# Patient Record
Sex: Male | Born: 2000 | Race: White | Hispanic: No | Marital: Single
Health system: Midwestern US, Community
[De-identification: ages and names within clinical notes are randomized; demographics above are authoritative.]

---
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
## 2019-01-28 ENCOUNTER — Ambulatory Visit: Admitting: Family Medicine

## 2022-04-14 ENCOUNTER — Emergency Department: Admit: 2022-04-15

## 2022-04-14 DIAGNOSIS — J40 Bronchitis, not specified as acute or chronic: Secondary | ICD-10-CM

## 2022-04-14 NOTE — Discharge Instructions (Signed)
Take medications as prescribed, follow-up with primary care doctor, return to the emergency department for any worsening symptoms, problems or concerns

## 2022-04-14 NOTE — ED Provider Notes (Cosign Needed)
[facility]ERGENCY DEPT  EMERGENCY DEPARTMENT ENCOUNTER      Pt Name: Barry English  MRN: [id number]  Birthdate 05-26-2001  Date of evaluation: 04/14/2022  Provider: Philomena Doheny Zakariyya Helfman, PA-C    CHIEF COMPLAINT       Chief Complaint   Patient presents with    Pleurisy     C/o cough and pain with inspiration and pt quit vaping approx 1.5 weeks ago. Pt also c/o feeling a "popping in the top left of my heart" x 1-2 years. Pt denies any pain and reports that he's recently being having anxiety which is new for him.          HISTORY OF PRESENT ILLNESS   (Location/Symptom, Timing/Onset, Context/Setting, Quality, Duration, Modifying Factors, Severity)  Note limiting factors.       Patient is a 21 year old male who presents emergency department with complaints of chest pain.  Patient states that he quit vaping about a week and a half ago, has had cough for the past several days.  States that this is causing chest pain to his left rib area.  He also reports intermittent chest pain with feeling a "pop" on the left side of his chest when he turns too far to the left side.  States that he recently started to have anxiety symptoms chest pain that he is experiencing now on his left side is associated with cough and deep inspiration.  Denies any shortness of breath, fever, chills, myalgias, arthralgias.  No other complaints at this time.        Nursing Notes were reviewed.    REVIEW OF SYSTEMS    (2-9 systems for level 4, 10 or more for level 5)     Review of Systems   Constitutional:  Negative for fever.   Respiratory:  Positive for cough. Negative for shortness of breath.    Cardiovascular:  Positive for chest pain.   All other systems reviewed and are negative.    Except as noted above the remainder of the review of systems was reviewed and negative.       PAST MEDICAL HISTORY   No past medical history on file.      SURGICAL HISTORY     No past surgical history on file.      CURRENT MEDICATIONS       Previous Medications    No  medications on file       ALLERGIES     Amoxicillin and Penicillins    FAMILY HISTORY     No family history on file.       SOCIAL HISTORY       Social History     Socioeconomic History    Marital status: Single       SCREENINGS         Glasgow Coma Scale  Eye Opening: Spontaneous  Best Verbal Response: Oriented  Best Motor Response: Obeys commands  Glasgow Coma Scale Score: 15                     CIWA Assessment  BP: (!) 146/99  Pulse: 74                 PHYSICAL EXAM    (up to 7 for level 4, 8 or more for level 5)     ED Triage Vitals [04/14/22 2125]   BP Temp Temp Source Pulse Respirations SpO2 Height Weight - Scale   (!) 146/99 98.6 F (37 C) Oral 74  18 99 % 6\' 2"  (1.88 m) 162 lb (73.5 kg)       Physical Exam  Vitals and nursing note reviewed.   Constitutional:       General: He is not in acute distress.     Appearance: Normal appearance. He is not ill-appearing, toxic-appearing or diaphoretic.   HENT:      Head: Normocephalic and atraumatic.      Mouth/Throat:      Mouth: Mucous membranes are moist.   Eyes:      Extraocular Movements: Extraocular movements intact.      Pupils: Pupils are equal, round, and reactive to light.   Cardiovascular:      Rate and Rhythm: Normal rate and regular rhythm.      Pulses: Normal pulses.      Heart sounds: Normal heart sounds. No murmur heard.    No gallop.   Pulmonary:      Effort: Pulmonary effort is normal.      Breath sounds: Normal breath sounds. No stridor. No wheezing or rhonchi.   Abdominal:      General: Abdomen is flat. There is no distension.      Palpations: Abdomen is soft. There is no mass.      Tenderness: There is no abdominal tenderness. There is no guarding or rebound.   Musculoskeletal:         General: Normal range of motion.      Cervical back: Normal range of motion and neck supple.   Skin:     General: Skin is warm and dry.      Capillary Refill: Capillary refill takes less than 2 seconds.   Neurological:      General: No focal deficit present.       Mental Status: He is alert and oriented to person, place, and time.   Psychiatric:         Mood and Affect: Mood normal.         Behavior: Behavior normal.       DIAGNOSTIC RESULTS     EKG: All EKG's are interpreted by the Emergency Department Physician who either signs or Co-signs this chart in the absence of a cardiologist.      RADIOLOGY:   Non-plain film images such as CT, Ultrasound and MRI are read by the radiologist. Plain radiographic images are visualized and preliminarily interpreted by the emergency physician with the below findings:        Interpretation per the Radiologist below, if available at the time of this note:    XR CHEST PORTABLE    (Results Pending)         ED BEDSIDE ULTRASOUND:   Performed by ED Physician - none    LABS:  Labs Reviewed - No data to display    All other labs were within normal range or not returned as of this dictation.    EMERGENCY DEPARTMENT COURSE and DIFFERENTIAL DIAGNOSIS/MDM:   Vitals:    Vitals:    04/14/22 2125   BP: (!) 146/99   Pulse: 74   Resp: 18   Temp: 98.6 F (37 C)   TempSrc: Oral   SpO2: 99%   Weight: 73.5 kg   Height: 6\' 2"  (1.88 m)         MDM  Number of Diagnoses or Management Options  Diagnosis management comments: Differential diagnosis anxiety, pneumonia, chest wall pain  Patient in the emergency department for evaluation of multiple complaints, chief complaint of cough causing left-sided chest  pain, intermittent anxiety and feeling a "popping" in his chest for the past few years.  EKG shows no acute ischemic changes, no T wave or ST segment changes reviewed by myself and emergency medicine attending physician.  Chest x-ray shows some increased perihilar markings reviewed by myself and emergency medicine attending physician pending radiology overread.  Advised him to follow-up with his primary care doctor return to the emergency department for any worsening symptoms, problems or concerns.  He is in agreement with this plan of care, discharged home in  stable condition.       Amount and/or Complexity of Data Reviewed  Tests in the radiology section of CPT: reviewed  Tests in the medicine section of CPT: reviewed          REASSESSMENT          CRITICAL CARE TIME       CONSULTS:  None    PROCEDURES:  Unless otherwise noted below, none     Procedures        FINAL IMPRESSION      1. Bronchitis          DISPOSITION/PLAN   DISPOSITION Decision To Discharge 04/14/2022 10:48:35 PM      PATIENT REFERRED TO:  Pcp No          [facility]  [address]  [place] [address]  [phone]          DISCHARGE MEDICATIONS:  New Prescriptions    BENZONATATE (TESSALON) 200 MG CAPSULE    Take 1 capsule by mouth 3 times daily as needed for Cough    IBUPROFEN (ADVIL;MOTRIN) 800 MG TABLET    Take 1 tablet by mouth every 8 hours as needed for Pain     Controlled Substances Monitoring:     No flowsheet data found.    (Please note that portions of this note were completed with a voice recognition program.  Efforts were made to edit the dictations but occasionally words are mis-transcribed.)    Eliot Ford, PA-C (electronically signed)  Attending Emergency Physician           Eliot Ford, PA-C  04/14/22 2249

## 2022-04-15 ENCOUNTER — Inpatient Hospital Stay: Admit: 2022-04-15 | Discharge: 2022-04-15 | Disposition: A | Discharge status: Home / Self Care

## 2022-04-15 MED ORDER — BENZONATATE 200 MG PO CAPS
200 MG | ORAL_CAPSULE | Freq: Three times a day (TID) | ORAL | 0 refills | Status: AC | PRN
Start: 2022-04-15 — End: 2022-04-21

## 2022-04-15 MED ORDER — HYDROXYZINE HCL 25 MG PO TABS
25 MG | ORAL_TABLET | Freq: Three times a day (TID) | ORAL | 0 refills | Status: AC | PRN
Start: 2022-04-15 — End: 2022-04-24

## 2022-04-15 MED ORDER — IBUPROFEN 800 MG PO TABS
800 MG | ORAL_TABLET | Freq: Three times a day (TID) | ORAL | 0 refills | Status: AC | PRN
Start: 2022-04-15 — End: ?

## 2022-04-17 LAB — EKG 12-LEAD
P Axis: 81 degrees
P-R Interval: 138 ms
Q-T Interval: 354 ms
QRS Duration: 82 ms
QTc Calculation (Bazett): 382 ms
R Axis: 57 degrees
T Axis: 52 degrees
Ventricular Rate: 74 {beats}/min
